# Patient Record
Sex: Female | Born: 1935 | Race: Black or African American | Hispanic: No
Health system: Southern US, Community
[De-identification: ages and names within clinical notes are randomized; demographics above are authoritative.]

---

## 2006-10-23 ENCOUNTER — Emergency Department (HOSPITAL_COMMUNITY): Admission: EM | Admit: 2006-10-23 | Discharge: 2006-10-23 | Payer: Self-pay | Admitting: Emergency Medicine

## 2008-04-05 IMAGING — CR DG CHEST 2V
2 series · 2 of 2 positions shown · non-contrast
Comparison: none

HISTORY: Dyspnea history emphysema

[w chest pa]
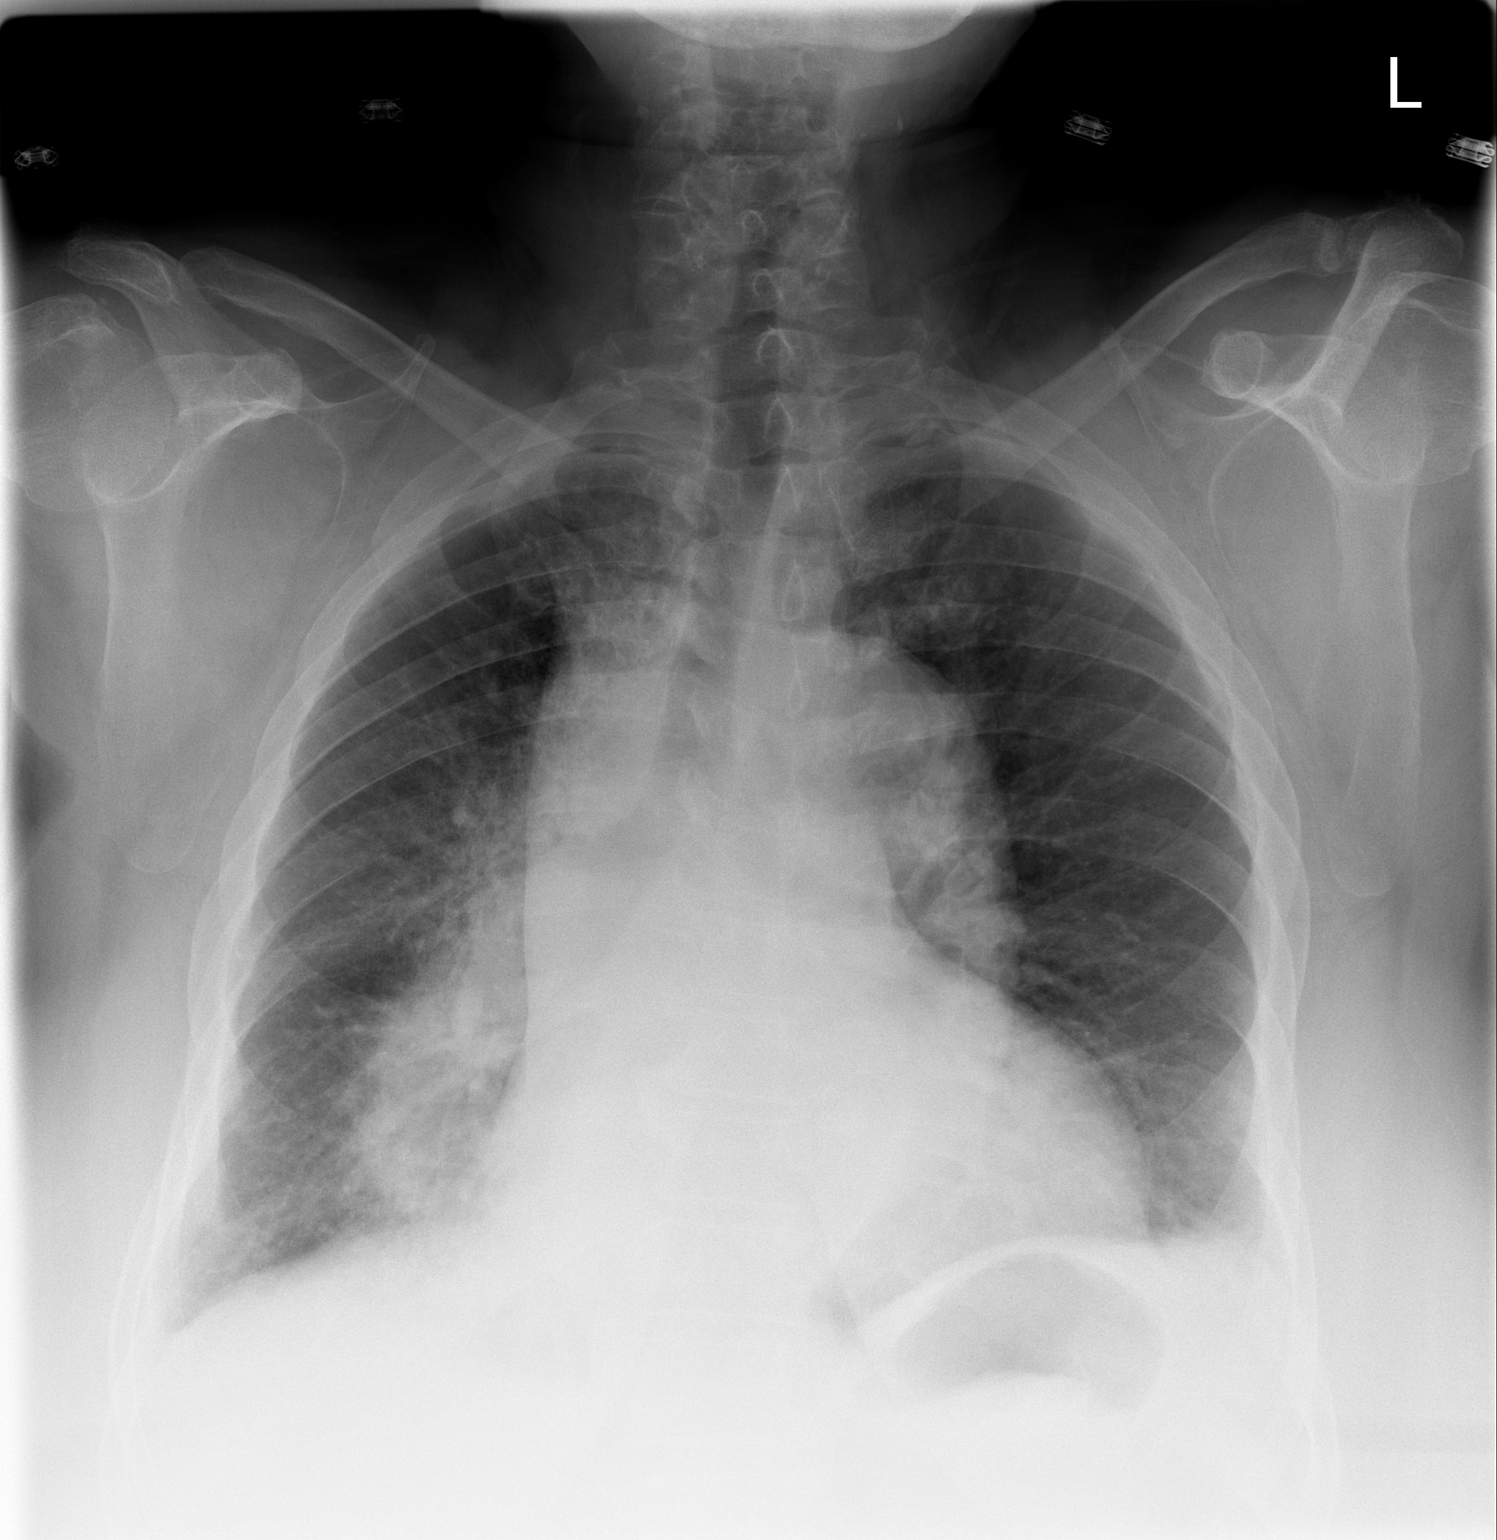

[w chest lat]
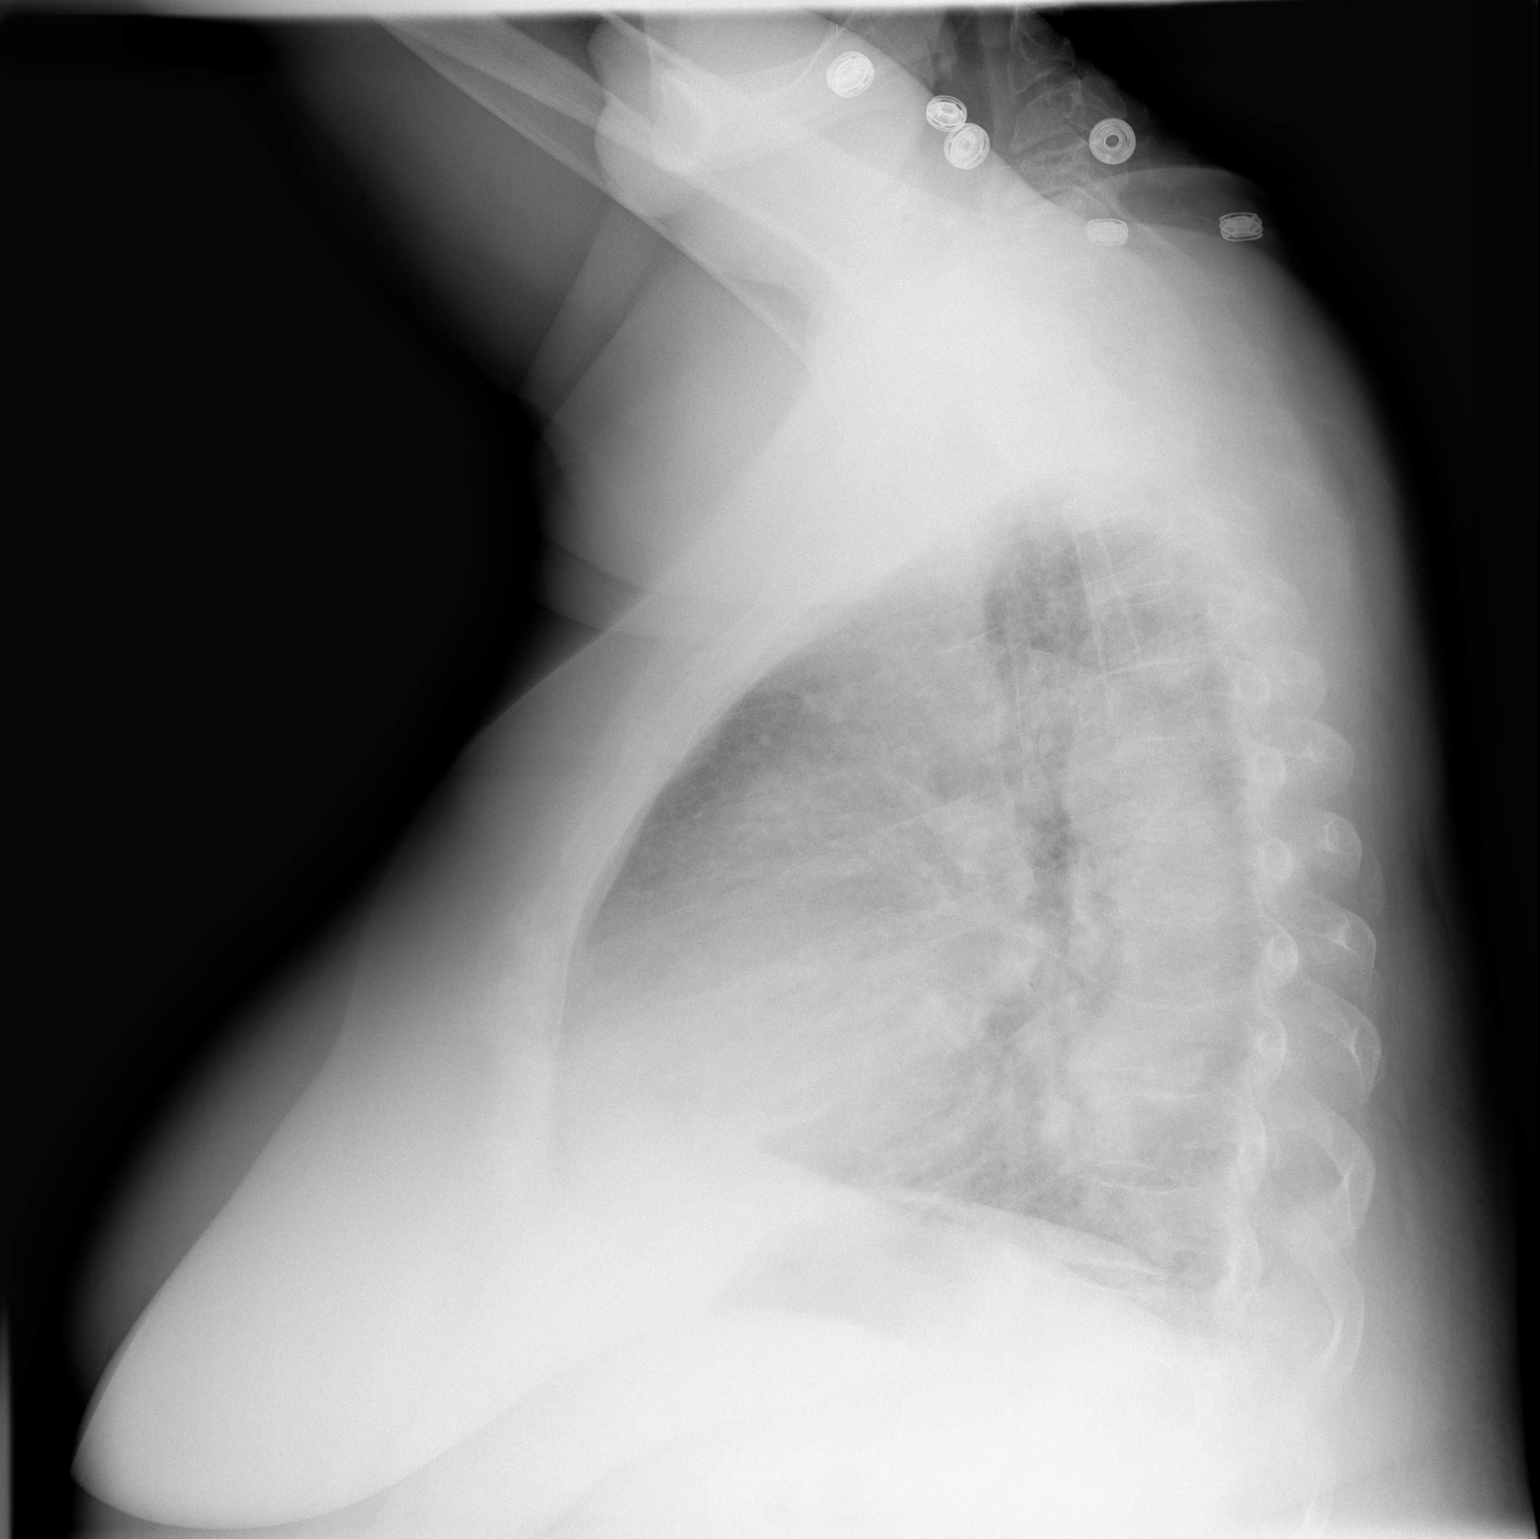

[2 of 2 positions shown; findings below may reference images not displayed]

CHEST 2 VIEWS:

No prior study for comparison.

Cardiac enlargement.
Slight rotation to right.
Markedly tortuous aorta.
Diffuse bronchitic changes with right perihilar and right lower lobe
infiltrates.
No pleural effusion or pneumothorax.
Minimal left base atelectasis or consolidation.
Bony demineralization.
IMPRESSION: Cardiomegaly with markedly tortuous aorta.
Bronchitic changes with right perihilar and right lower lobe infiltrates.
Recommend followup until resolution to exclude underlying mass lesion in right
lower lobe.

## 2014-05-11 ENCOUNTER — Emergency Department (INDEPENDENT_AMBULATORY_CARE_PROVIDER_SITE_OTHER)
Admission: EM | Admit: 2014-05-11 | Discharge: 2014-05-11 | Disposition: A | Discharge status: Home / Self Care | Payer: Medicare Other | Source: Home / Self Care | Attending: Emergency Medicine | Admitting: Emergency Medicine

## 2014-05-11 ENCOUNTER — Encounter (HOSPITAL_COMMUNITY): Payer: Self-pay | Admitting: Emergency Medicine

## 2014-05-11 ENCOUNTER — Emergency Department (INDEPENDENT_AMBULATORY_CARE_PROVIDER_SITE_OTHER): Payer: Medicare Other

## 2014-05-11 DIAGNOSIS — IMO0002 Reserved for concepts with insufficient information to code with codable children: Secondary | ICD-10-CM

## 2014-05-11 DIAGNOSIS — S92919A Unspecified fracture of unspecified toe(s), initial encounter for closed fracture: Secondary | ICD-10-CM

## 2014-05-11 NOTE — Discharge Instructions (Signed)
Buddy Taping of Toes °We have taped your toes together to keep them from moving. This is called "buddy taping" since we used a part of your own body to keep the injured part still. We placed soft padding between your toes to keep them from rubbing against each other. Buddy taping will help with healing and to reduce pain. Keep your toes buddy taped together for as long as directed by your caregiver. °HOME CARE INSTRUCTIONS  °· Raise your injured area above the level of your heart while sitting or lying down. Prop it up with pillows. °· An ice pack used every twenty minutes, while awake, for the first one to two days may be helpful. Put ice in a plastic bag and put a towel between the bag and your skin. °· Watch for signs that the taping is too tight. These signs may be: °¨ Numbness of your taped toes. °¨ Coolness of your taped toes. °¨ Color change in the area beyond the tape. °¨ Increased pain. °· If you have any of these signs, loosen or rewrap the tape. If you need to loosen or rewrap the buddy tape, make sure you use the padding again. °SEEK IMMEDIATE MEDICAL CARE IF:  °· You have worse pain, swelling, inflammation (soreness), drainage or bleeding after you rewrap the tape. °· Any new problems occur. °MAKE SURE YOU:  °· Understand these instructions. °· Will watch your condition. °· Will get help right away if you are not doing well or get worse. °Document Released: 07/23/2004 Document Revised: 01/11/2012 Document Reviewed: 10/16/2008 °[employer]® Patient Information ©2015 [employer]. This information is not intended to replace advice given to you by your health care provider. Make sure you discuss any questions you have with your health care provider. ° °

## 2014-05-11 NOTE — ED Provider Notes (Signed)
  Chief Complaint   Chief Complaint  Patient presents with  . Toe Injury    right great toe    History of Present Illness   Carly Aguilar is a 80 year old female who dropped an oxygen tank on her right great toe yesterday. The tip of the toes painful, and the entire toe is black and blue. It hurts to move the toe and to walk.  Review of Systems   Other than as noted above, the patient denies any of the following symptoms: Systemic:  No fevers or chills. Musculoskeletal:  No joint pain or arthritis.  Neurological:  No muscular weakness, paresthesias.   PMFSH   Past medical history, family history, social history, meds, and allergies were reviewed.     Physical  Examination     Vital signs:  BP 169/85  Pulse 79  Temp(Src) 99.4 F (37.4 C) (Oral)  Resp 16  SpO2 94% Gen:  Alert and oriented times 3.  In no distress. Musculoskeletal:  Exam of the foot reveals there is bruising and ecchymosis over the entire toe. There is tenderness to palpation of the tip of the toe. It hurts to move the toe.  Otherwise, all joints had a full a ROM with no swelling, bruising or deformity.  No edema, pulses full. Extremities were warm and pink.  Capillary refill was brisk.  Skin:  Clear, warm and dry.  No rash. Neuro:  Alert and oriented times 3.  Muscle strength was normal.  Sensation was intact to light touch.    Radiology   Dg Foot Complete Right  05/11/2014   CLINICAL DATA:  Great toe pain, dropped oxygen tank on to a total 1 day ago  EXAM: RIGHT FOOT COMPLETE - 3+ VIEW  COMPARISON:  None  FINDINGS: Slight osseous demineralization.  Comminuted nondisplaced fracture distal phalanx great toe.  No definite intra-articular extension at IP joint.  Joint spaces preserved.  No additional fracture, dislocation, or bone destruction.  Small plantar and Achilles insertion calcaneal spurs.  IMPRESSION: Comminuted nondisplaced fracture at distal phalanx RIGHT great toe.   Electronically Signed   By: Ulyses SouthwardMark   Boles M.D.   On: 05/11/2014 19:40   I reviewed the images independently and personally and concur with the radiologist's findings.  [facility]   The toes buddy taped and she was placed in a postoperative boot.  Assessment   The encounter diagnosis was Closed fracture of distal phalanx of toe.  Plan    1.  Meds:  The following meds were prescribed:   Discharge Medication List as of 05/11/2014  8:00 PM      2.  Patient Education/Counseling:  The patient was given appropriate handouts, self care instructions, and instructed in symptomatic relief including rest and activity, elevation, application of ice and compression.    3.  Follow up:  The patient was told to follow up here if no better in 3 to 4 days, or sooner if becoming worse in any way, and given some red flag symptoms such as worsening pain or neurological symptoms which would prompt immediate return.  Follow up here as necessary.       Reuben Likesavid C Amaziah Raisanen, MD 05/11/14 2204

## 2014-05-11 NOTE — ED Notes (Signed)
Patient states dropped her oxygen tank On her right great toe yesterday Toe is swollen and black and blue

## 2015-06-03 DEATH — deceased

## 2015-10-23 IMAGING — CR DG FOOT COMPLETE 3+V*R*
3 series · 3 of 3 positions shown · non-contrast
Comparison: None

CLINICAL DATA: Great toe pain, dropped oxygen tank on to a total 1
day ago

EXAM:
RIGHT FOOT COMPLETE - 3+ VIEW

[view not recorded (1 of 3)]
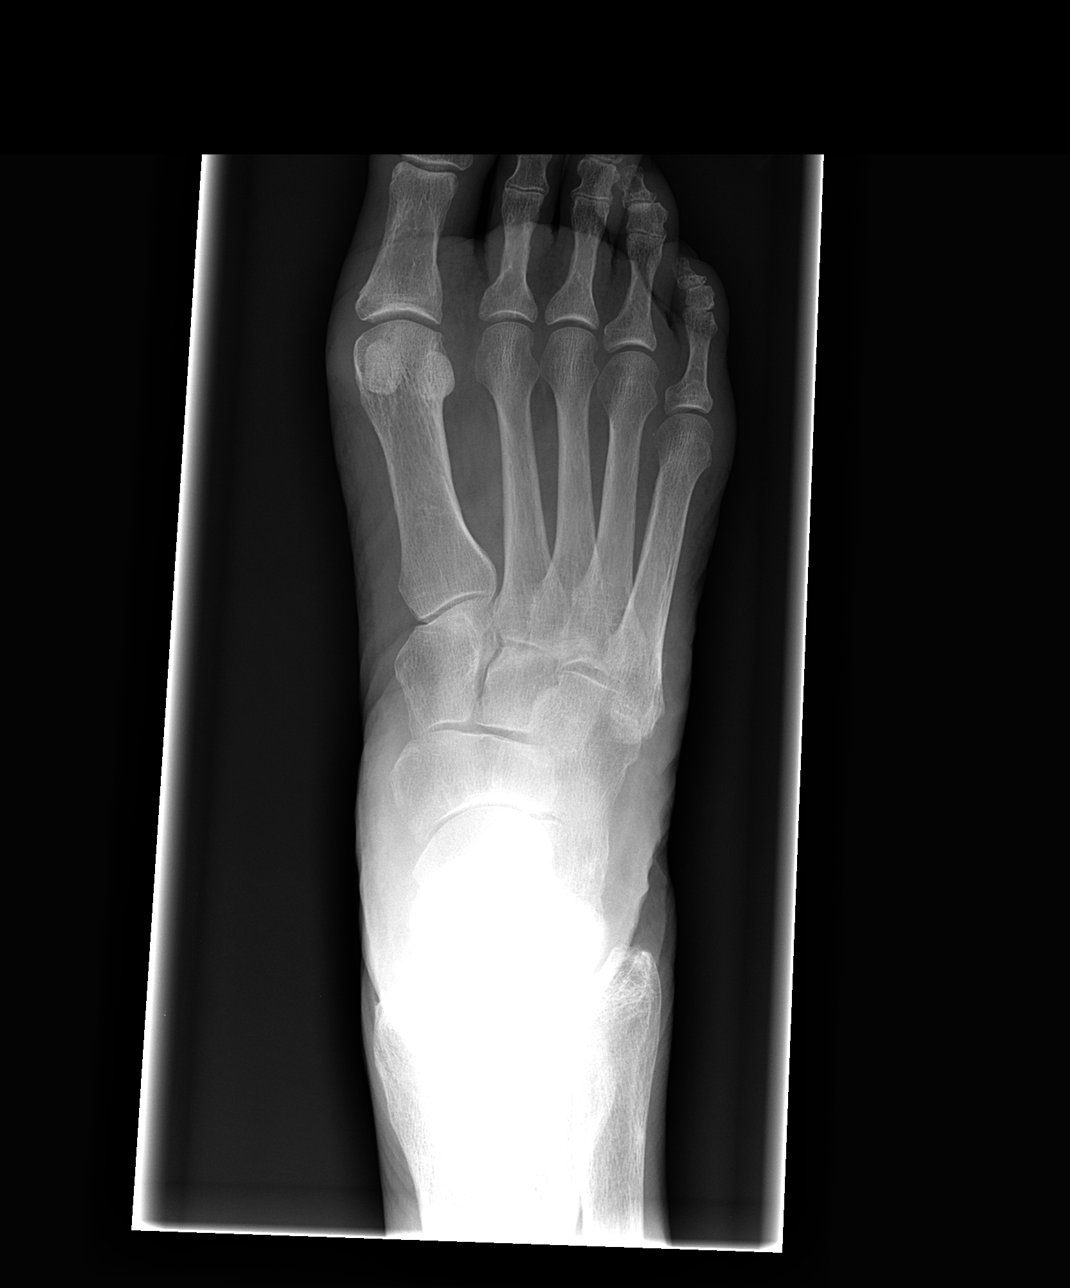

[view not recorded (2 of 3)]
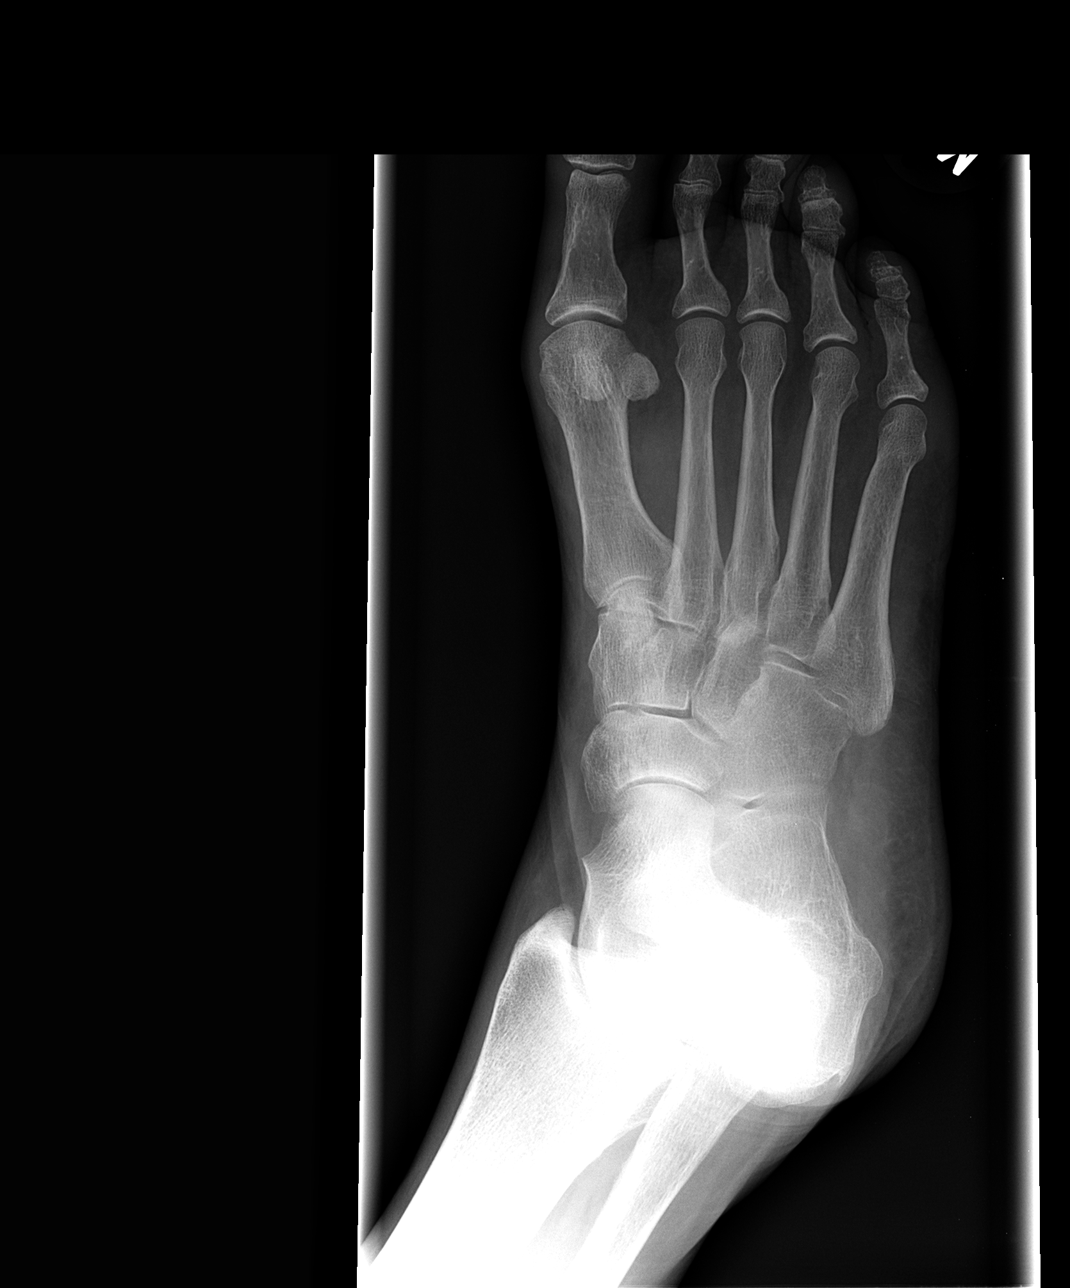

[view not recorded (3 of 3)]
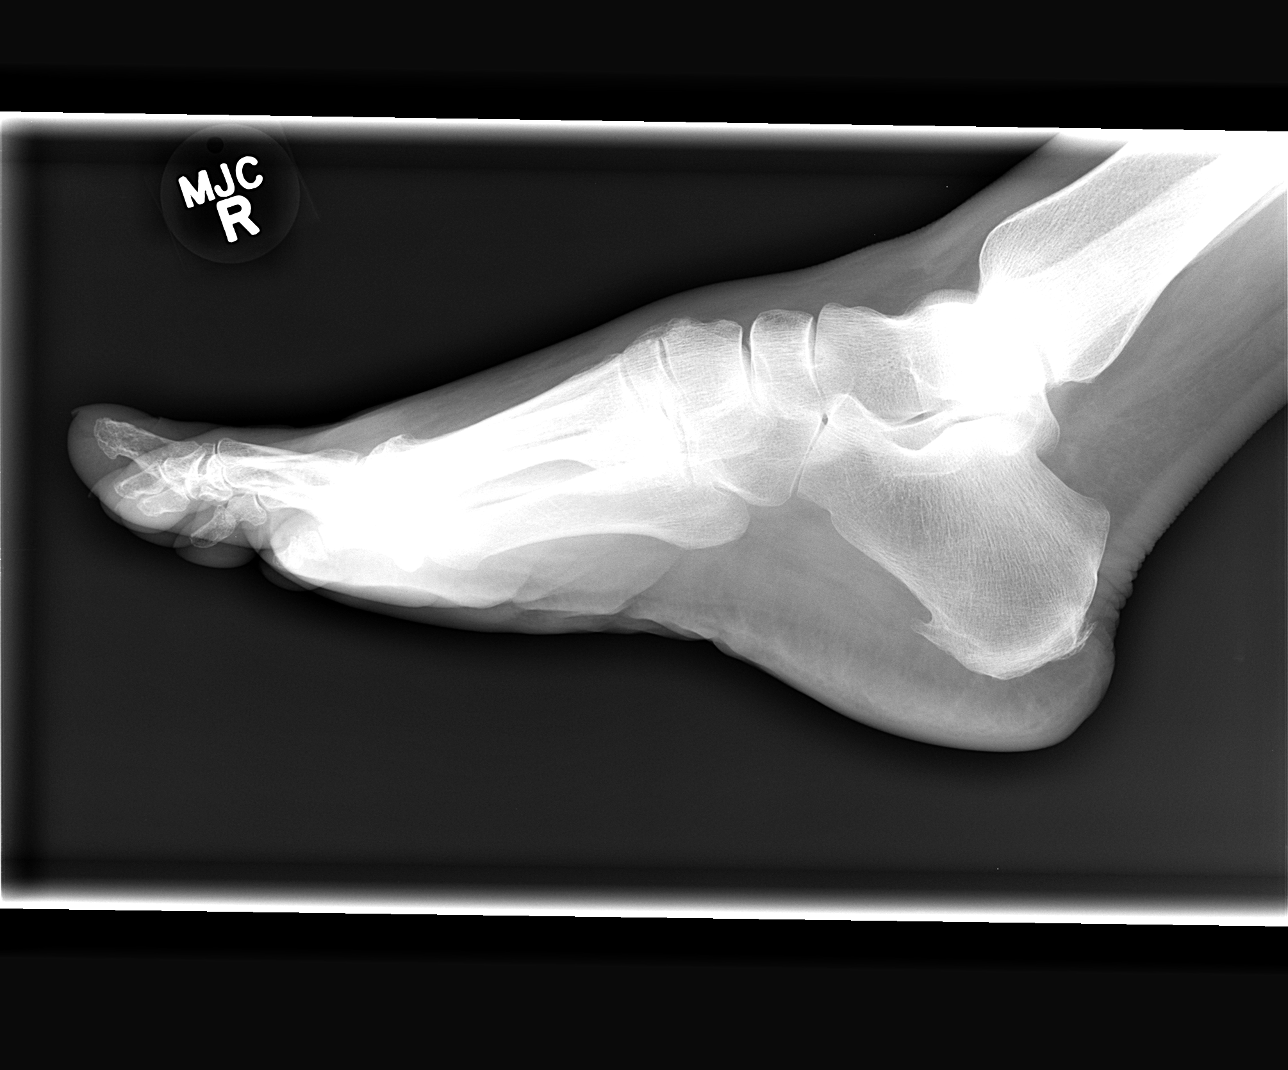

[3 of 3 positions shown; findings below may reference images not displayed]

FINDINGS: Slight osseous demineralization.

Comminuted nondisplaced fracture distal phalanx great toe.

No definite intra-articular extension at IP joint.

Joint spaces preserved.

No additional fracture, dislocation, or bone destruction.

Small plantar and Achilles insertion calcaneal spurs.
IMPRESSION: Comminuted nondisplaced fracture at distal phalanx RIGHT great toe.
